# Patient Record
Sex: Male | Born: 1975 | Race: Black or African American | Hispanic: No | Marital: Single | Smoking: Current every day smoker
Health system: Southern US, Community
[De-identification: ages and names within clinical notes are randomized; demographics above are authoritative.]

## PROBLEM LIST (undated history)

## (undated) DIAGNOSIS — M549 Dorsalgia, unspecified: Secondary | ICD-10-CM

## (undated) DIAGNOSIS — I10 Essential (primary) hypertension: Secondary | ICD-10-CM

---

## 2009-01-01 ENCOUNTER — Emergency Department (HOSPITAL_COMMUNITY): Admission: EM | Admit: 2009-01-01 | Discharge: 2009-01-01 | Payer: Self-pay | Admitting: Family Medicine

## 2009-12-15 ENCOUNTER — Emergency Department: Payer: Self-pay | Admitting: Internal Medicine

## 2010-01-17 ENCOUNTER — Emergency Department: Payer: Self-pay | Admitting: Emergency Medicine

## 2010-01-20 ENCOUNTER — Emergency Department: Payer: Self-pay | Admitting: Emergency Medicine

## 2010-01-21 ENCOUNTER — Emergency Department: Payer: Self-pay | Admitting: Emergency Medicine

## 2010-06-03 ENCOUNTER — Ambulatory Visit: Payer: Self-pay

## 2010-09-10 ENCOUNTER — Emergency Department: Payer: Self-pay | Admitting: Unknown Physician Specialty

## 2010-09-12 ENCOUNTER — Emergency Department: Payer: Self-pay | Admitting: Emergency Medicine

## 2014-07-02 ENCOUNTER — Emergency Department (HOSPITAL_COMMUNITY): Payer: No Typology Code available for payment source

## 2014-07-02 ENCOUNTER — Encounter (HOSPITAL_COMMUNITY): Payer: Self-pay | Admitting: Emergency Medicine

## 2014-07-02 ENCOUNTER — Emergency Department (HOSPITAL_COMMUNITY)
Admission: EM | Admit: 2014-07-02 | Discharge: 2014-07-02 | Disposition: A | Discharge status: Home / Self Care | Payer: No Typology Code available for payment source | Attending: Emergency Medicine | Admitting: Emergency Medicine

## 2014-07-02 DIAGNOSIS — S3992XA Unspecified injury of lower back, initial encounter: Secondary | ICD-10-CM | POA: Insufficient documentation

## 2014-07-02 DIAGNOSIS — Y92410 Unspecified street and highway as the place of occurrence of the external cause: Secondary | ICD-10-CM | POA: Diagnosis not present

## 2014-07-02 DIAGNOSIS — S139XXA Sprain of joints and ligaments of unspecified parts of neck, initial encounter: Secondary | ICD-10-CM

## 2014-07-02 DIAGNOSIS — Y9389 Activity, other specified: Secondary | ICD-10-CM | POA: Diagnosis not present

## 2014-07-02 DIAGNOSIS — S134XXA Sprain of ligaments of cervical spine, initial encounter: Secondary | ICD-10-CM | POA: Diagnosis not present

## 2014-07-02 DIAGNOSIS — Z720 Tobacco use: Secondary | ICD-10-CM | POA: Insufficient documentation

## 2014-07-02 DIAGNOSIS — S199XXA Unspecified injury of neck, initial encounter: Secondary | ICD-10-CM | POA: Diagnosis present

## 2014-07-02 DIAGNOSIS — S0990XA Unspecified injury of head, initial encounter: Secondary | ICD-10-CM | POA: Insufficient documentation

## 2014-07-02 DIAGNOSIS — I10 Essential (primary) hypertension: Secondary | ICD-10-CM | POA: Insufficient documentation

## 2014-07-02 DIAGNOSIS — Z79899 Other long term (current) drug therapy: Secondary | ICD-10-CM | POA: Diagnosis not present

## 2014-07-02 HISTORY — DX: Essential (primary) hypertension: I10

## 2014-07-02 HISTORY — DX: Dorsalgia, unspecified: M54.9

## 2014-07-02 MED ORDER — ONDANSETRON 4 MG PO TBDP: 4.0000 mg | ORAL_TABLET | Freq: Once | ORAL | Status: DC

## 2014-07-02 MED ORDER — HYDROCODONE-ACETAMINOPHEN 5-325 MG PO TABS: 1.0000 | ORAL_TABLET | Freq: Once | ORAL | Status: DC

## 2014-07-02 MED ORDER — IBUPROFEN 600 MG PO TABS: 600.0000 mg | ORAL_TABLET | Freq: Four times a day (QID) | ORAL | Status: AC | PRN

## 2014-07-02 MED ORDER — TRAMADOL HCL 50 MG PO TABS
50.0000 mg | ORAL_TABLET | Freq: Once | ORAL | Status: AC
  Administered 2014-07-02: 50 mg via ORAL
  Filled 2014-07-02: qty 1

## 2014-07-02 MED ORDER — CYCLOBENZAPRINE HCL 10 MG PO TABS: 10.0000 mg | ORAL_TABLET | Freq: Two times a day (BID) | ORAL | Status: DC | PRN

## 2014-07-02 MED ORDER — TRAMADOL HCL 50 MG PO TABS: 50.0000 mg | ORAL_TABLET | Freq: Four times a day (QID) | ORAL | Status: DC | PRN

## 2014-07-02 NOTE — ED Notes (Signed)
Declined W/C at D/C and was escorted to lobby by RN. 
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
## 2014-07-02 NOTE — Discharge Instructions (Signed)
Cervical Sprain °A cervical sprain is an injury in the neck in which the strong, fibrous tissues (ligaments) that connect your neck bones stretch or tear. Cervical sprains can range from mild to severe. Severe cervical sprains can cause the neck vertebrae to be unstable. This can lead to damage of the spinal cord and can result in serious nervous system problems. The amount of time it takes for a cervical sprain to get better depends on the cause and extent of the injury. Most cervical sprains heal in 1 to 3 weeks. °CAUSES  °Severe cervical sprains may be caused by:  °· Contact sport injuries (such as from football, rugby, wrestling, hockey, auto racing, gymnastics, diving, martial arts, or boxing).   °· Motor vehicle collisions.   °· Whiplash injuries. This is an injury from a sudden forward and backward whipping movement of the head and neck.  °· Falls.   °Mild cervical sprains may be caused by:  °· Being in an awkward position, such as while cradling a telephone between your ear and shoulder.   °· Sitting in a chair that does not offer proper support.   °· Working at a poorly designed computer station.   °· Looking up or down for long periods of time.   °SYMPTOMS  °· Pain, soreness, stiffness, or a burning sensation in the front, back, or sides of the neck. This discomfort may develop immediately after the injury or slowly, 24 hours or more after the injury.   °· Pain or tenderness directly in the middle of the back of the neck.   °· Shoulder or upper back pain.   °· Limited ability to move the neck.   °· Headache.   °· Dizziness.   °· Weakness, numbness, or tingling in the hands or arms.   °· Muscle spasms.   °· Difficulty swallowing or chewing.   °· Tenderness and swelling of the neck.   °DIAGNOSIS  °Most of the time your health care provider can diagnose a cervical sprain by taking your history and doing a physical exam. Your health care provider will ask about previous neck injuries and any known neck  problems, such as arthritis in the neck. X-rays may be taken to find out if there are any other problems, such as with the bones of the neck. Other tests, such as a CT scan or MRI, may also be needed.  °TREATMENT  °Treatment depends on the severity of the cervical sprain. Mild sprains can be treated with rest, keeping the neck in place (immobilization), and pain medicines. Severe cervical sprains are immediately immobilized. Further treatment is done to help with pain, muscle spasms, and other symptoms and may include: °· Medicines, such as pain relievers, numbing medicines, or muscle relaxants.   °· Physical therapy. This may involve stretching exercises, strengthening exercises, and posture training. Exercises and improved posture can help stabilize the neck, strengthen muscles, and help stop symptoms from returning.   °HOME CARE INSTRUCTIONS  °· Put ice on the injured area.   °¨ Put ice in a plastic bag.   °¨ Place a towel between your skin and the bag.   °¨ Leave the ice on for 15-20 minutes, 3-4 times a day.   °· If your injury was severe, you may have been given a cervical collar to wear. A cervical collar is a two-piece collar designed to keep your neck from moving while it heals. °¨ Do not remove the collar unless instructed by your health care provider. °¨ If you have long hair, keep it outside of the collar. °¨ Ask your health care provider before making any adjustments to your collar. Minor   adjustments may be required over time to improve comfort and reduce pressure on your chin or on the back of your head. °¨ If you are allowed to remove the collar for cleaning or bathing, follow your health care provider's instructions on how to do so safely. °¨ Keep your collar clean by wiping it with mild soap and water and drying it completely. If the collar you have been given includes removable pads, remove them every 1-2 days and hand wash them with soap and water. Allow them to air dry. They should be completely  dry before you wear them in the collar. °¨ If you are allowed to remove the collar for cleaning and bathing, wash and dry the skin of your neck. Check your skin for irritation or sores. If you see any, tell your health care provider. °¨ Do not drive while wearing the collar.   °· Only take over-the-counter or prescription medicines for pain, discomfort, or fever as directed by your health care provider.   °· Keep all follow-up appointments as directed by your health care provider.   °· Keep all physical therapy appointments as directed by your health care provider.   °· Make any needed adjustments to your workstation to promote good posture.   °· Avoid positions and activities that make your symptoms worse.   °· Warm up and stretch before being active to help prevent problems.   °SEEK MEDICAL CARE IF:  °· Your pain is not controlled with medicine.   °· You are unable to decrease your pain medicine over time as planned.   °· Your activity level is not improving as expected.   °SEEK IMMEDIATE MEDICAL CARE IF:  °· You develop any bleeding. °· You develop stomach upset. °· You have signs of an allergic reaction to your medicine.   °· Your symptoms get worse.   °· You develop new, unexplained symptoms.   °· You have numbness, tingling, weakness, or paralysis in any part of your body.   °MAKE SURE YOU:  °· Understand these instructions. °· Will watch your condition. °· Will get help right away if you are not doing well or get worse. °Document Released: 07/06/2007 Document Revised: 09/13/2013 Document Reviewed: 03/16/2013 °[employer]® Patient Information ©2015 [employer]. This information is not intended to replace advice given to you by your health care provider. Make sure you discuss any questions you have with your health care provider. ° °Motor Vehicle Collision °It is common to have multiple bruises and sore muscles after a motor vehicle collision (MVC). These tend to feel worse for the first 24 hours. You may have  the most stiffness and soreness over the first several hours. You may also feel worse when you wake up the first morning after your collision. After this point, you will usually begin to improve with each day. The speed of improvement often depends on the severity of the collision, the number of injuries, and the location and nature of these injuries. °HOME CARE INSTRUCTIONS °· Put ice on the injured area. °¨ Put ice in a plastic bag. °¨ Place a towel between your skin and the bag. °¨ Leave the ice on for 15-20 minutes, 3-4 times a day, or as directed by your health care provider. °· Drink enough fluids to keep your urine clear or pale yellow. Do not drink alcohol. °· Take a warm shower or bath once or twice a day. This will increase blood flow to sore muscles. °· You may return to activities as directed by your caregiver. Be careful when lifting, as this may aggravate neck or back   pain. °· Only take over-the-counter or prescription medicines for pain, discomfort, or fever as directed by your caregiver. Do not use aspirin. This may increase bruising and bleeding. °SEEK IMMEDIATE MEDICAL CARE IF: °· You have numbness, tingling, or weakness in the arms or legs. °· You develop severe headaches not relieved with medicine. °· You have severe neck pain, especially tenderness in the middle of the back of your neck. °· You have changes in bowel or bladder control. °· There is increasing pain in any area of the body. °· You have shortness of breath, light-headedness, dizziness, or fainting. °· You have chest pain. °· You feel sick to your stomach (nauseous), throw up (vomit), or sweat. °· You have increasing abdominal discomfort. °· There is blood in your urine, stool, or vomit. °· You have pain in your shoulder (shoulder strap areas). °· You feel your symptoms are getting worse. °MAKE SURE YOU: °· Understand these instructions. °· Will watch your condition. °· Will get help right away if you are not doing well or get  worse. °Document Released: 09/08/2005 Document Revised: 01/23/2014 Document Reviewed: 02/05/2011 °[employer]® Patient Information ©2015 [employer]. This information is not intended to replace advice given to you by your health care provider. Make sure you discuss any questions you have with your health care provider. ° °
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
## 2014-07-06 NOTE — ED Provider Notes (Signed)
Medical screening examination/treatment/procedure(s) were performed by non-physician practitioner and as supervising physician I was immediately available for consultation/collaboration.   EKG Interpretation None        Glynn OctaveStephen Evah Rashid, MD 07/06/14 2105
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
## 2019-10-06 ENCOUNTER — Encounter (HOSPITAL_COMMUNITY): Payer: Self-pay | Admitting: Emergency Medicine

## 2019-10-06 ENCOUNTER — Other Ambulatory Visit: Payer: Self-pay

## 2019-10-06 ENCOUNTER — Ambulatory Visit (HOSPITAL_COMMUNITY)
Admission: EM | Admit: 2019-10-06 | Discharge: 2019-10-06 | Disposition: A | Discharge status: Home / Self Care | Payer: Self-pay | Attending: Family Medicine | Admitting: Family Medicine

## 2019-10-06 DIAGNOSIS — Z202 Contact with and (suspected) exposure to infections with a predominantly sexual mode of transmission: Secondary | ICD-10-CM

## 2019-10-06 DIAGNOSIS — I10 Essential (primary) hypertension: Secondary | ICD-10-CM

## 2019-10-06 MED ORDER — METRONIDAZOLE 500 MG PO TABS
2000.0000 mg | ORAL_TABLET | Freq: Once | ORAL | Status: AC
Start: 2019-10-06 — End: 2019-10-06
  Administered 2019-10-06: 2000 mg via ORAL

## 2019-10-06 MED ORDER — METRONIDAZOLE 500 MG PO TABS
ORAL_TABLET | ORAL | Status: AC
  Filled 2019-10-06: qty 4

## 2019-10-06 NOTE — Discharge Instructions (Signed)
Treated you for trichomoniasis today.  Refrain from sexual activity for at least 1 week to prevent reinfection.

## 2019-10-06 NOTE — ED Provider Notes (Signed)
[facility]    CSN: [id number] Arrival date & time: 10/06/19  1201      History   Chief Complaint Chief Complaint  Patient presents with  . Exposure to STD    HPI Jon Sutton is a 44 y.o. male.   Patient is a 44 year old male with past medical history of hypertension.  He presents today for exposure to STDs.  Reporting he was exposed to trichomonas.  Sexually active with multiple partners.  Currently asymptomatic   Exposure to STD    Past Medical History:  Diagnosis Date  . Back pain   . Hypertension     There are no problems to display for this patient.   History reviewed. No pertinent surgical history.     Home Medications    Prior to Admission medications   Medication Sig Start Date End Date Taking? Authorizing Provider  ibuprofen (ADVIL,MOTRIN) 600 MG tablet Take 1 tablet (600 mg total) by mouth every 6 (six) hours as needed. 07/02/14   Marlon Pel, PA-C    Family History Family History  Problem Relation Age of Onset  . Healthy Mother     Social History Social History   Tobacco Use  . Smoking status: Current Every Day Smoker  . Smokeless tobacco: Never Used  Substance Use Topics  . Alcohol use: Yes  . Drug use: Yes    Types: Marijuana     Allergies   Patient has no known allergies.   Review of Systems Review of Systems  Genitourinary: Negative for decreased urine volume, difficulty urinating, discharge, dysuria, enuresis, flank pain, frequency, genital sores, hematuria, penile pain, penile swelling, scrotal swelling, testicular pain and urgency.     Physical Exam Triage Vital Signs ED Triage Vitals  Enc Vitals Group     BP 10/06/19 1224 (!) 156/101     Pulse Rate 10/06/19 1224 94     Resp 10/06/19 1224 18     Temp 10/06/19 1224 98.3 F (36.8 C)     Temp Source 10/06/19 1224 Oral     SpO2 10/06/19 1224 97 %     Weight --      Height --      Head Circumference --      Peak Flow --      Pain Score  10/06/19 1221 0     Pain Loc --      Pain Edu? --      Excl. in GC? --    No data found.  Updated Vital Signs BP (!) 156/101 (BP Location: Right Arm)   Pulse 94   Temp 98.3 F (36.8 C) (Oral)   Resp 18   SpO2 97%   Visual Acuity Right Eye Distance:   Left Eye Distance:   Bilateral Distance:    Right Eye Near:   Left Eye Near:    Bilateral Near:     Physical Exam Vitals and nursing note reviewed.  Constitutional:      Appearance: Normal appearance.  HENT:     Head: Normocephalic and atraumatic.     Nose: Nose normal.  Eyes:     Conjunctiva/sclera: Conjunctivae normal.  Pulmonary:     Effort: Pulmonary effort is normal.  Musculoskeletal:        General: Normal range of motion.     Cervical back: Normal range of motion.  Skin:    General: Skin is warm and dry.  Neurological:     Mental Status: He is alert.  Psychiatric:  Mood and Affect: Mood normal.      UC Treatments / Results  Labs (all labs ordered are listed, but only abnormal results are displayed) Labs Reviewed  CYTOLOGY, (ORAL, ANAL, URETHRAL) ANCILLARY ONLY    EKG   Radiology No results found.  Procedures Procedures (including critical care time)  Medications Ordered in UC Medications  metroNIDAZOLE (FLAGYL) tablet 2,000 mg (2,000 mg Oral Given 10/06/19 1310)    Initial Impression / Assessment and Plan / UC Course  I have reviewed the triage vital signs and the nursing notes.  Pertinent labs & imaging results that were available during my care of the patient were reviewed by me and considered in my medical decision making (see chart for details).     STD exposure- exposed to trichomoniasis. Asymptomatic Treated here today with flagyl.  Safe sex practices Follow up as needed for continued or worsening symptoms  Final Clinical Impressions(s) / UC Diagnoses   Final diagnoses:  STD exposure     Discharge Instructions     Treated you for trichomoniasis today.  Refrain  from sexual activity for at least 1 week to prevent reinfection.     ED Prescriptions    None     PDMP not reviewed this encounter.   Orvan July, NP 10/06/19 1353

## 2019-10-06 NOTE — ED Triage Notes (Signed)
Pt is here to be treated for Trichomonas.  He was told he was exposed.  Pt is having no symptoms.

## 2019-10-07 ENCOUNTER — Telehealth (HOSPITAL_COMMUNITY): Payer: Self-pay | Admitting: Emergency Medicine

## 2019-10-07 LAB — CYTOLOGY, (ORAL, ANAL, URETHRAL) ANCILLARY ONLY
Chlamydia: NEGATIVE
Neisseria Gonorrhea: NEGATIVE
Trichomonas: POSITIVE — AB

## 2019-10-07 NOTE — Telephone Encounter (Signed)
Trichomonas is positive. Rx metronidazole was given at the urgent care visit. Pt needs education to please refrain from sexual intercourse for 7 days to give the medicine time to work. Sexual partners need to be notified and tested/treated. Condoms may reduce risk of reinfection. Recheck for further evaluation if symptoms are not improving.   Patient contacted by phone and made aware of    results. Pt verbalized understanding and had all questions answered.   

## 2019-11-02 ENCOUNTER — Encounter (HOSPITAL_COMMUNITY): Payer: Self-pay | Admitting: Emergency Medicine

## 2019-11-02 ENCOUNTER — Other Ambulatory Visit: Payer: Self-pay

## 2019-11-02 ENCOUNTER — Ambulatory Visit (HOSPITAL_COMMUNITY)
Admission: EM | Admit: 2019-11-02 | Discharge: 2019-11-02 | Disposition: A | Discharge status: Home / Self Care | Payer: Self-pay | Attending: Family Medicine | Admitting: Family Medicine

## 2019-11-02 DIAGNOSIS — Z113 Encounter for screening for infections with a predominantly sexual mode of transmission: Secondary | ICD-10-CM | POA: Insufficient documentation

## 2019-11-02 NOTE — ED Triage Notes (Signed)
PT treated for trich at last visit. He had sexual contact day 3 after treatment. He is here for a recheck, no symptoms.

## 2019-11-02 NOTE — ED Provider Notes (Addendum)
[facility]    CSN: [id number] Arrival date & time: 11/02/19  1046      History   Chief Complaint Chief Complaint  Patient presents with  . Exposure to STD    HPI Jon Sutton is a 44 y.o. male.   Patient reports to urgent care today for STD check. He tested positive and was treated for trichomonas in 09/2019. He reports sexual contact with the same partner 3 days after both completed treatment. He denies penile discharge or painful urination. He is not interested in HIV or syphillis testing today.  He was not wearing condoms during these encounters  Patient is asking for treatment today, however we discussed that it is best practice that given he is not having symptoms that we should wait until his results are back. We discussed that we would call him with results and arrange treatment and that this does not require additional visits.   Patient actively playing game on phone throughout entire visit.     Past Medical History:  Diagnosis Date  . Back pain   . Hypertension     There are no problems to display for this patient.   History reviewed. No pertinent surgical history.     Home Medications    Prior to Admission medications   Medication Sig Start Date End Date Taking? Authorizing Provider  ibuprofen (ADVIL,MOTRIN) 600 MG tablet Take 1 tablet (600 mg total) by mouth every 6 (six) hours as needed. 07/02/14   Marlon Pel, PA-C    Family History Family History  Problem Relation Age of Onset  . Healthy Mother     Social History Social History   Tobacco Use  . Smoking status: Current Every Day Smoker  . Smokeless tobacco: Never Used  Substance Use Topics  . Alcohol use: Yes  . Drug use: Yes    Types: Marijuana     Allergies   Patient has no known allergies.   Review of Systems Review of Systems  Constitutional: Negative for fever.  Genitourinary: Negative for discharge, dysuria, frequency, genital sores, penile pain, penile  swelling, scrotal swelling, testicular pain and urgency.  Musculoskeletal: Negative for arthralgias and myalgias.  All other systems reviewed and are negative.    Physical Exam Triage Vital Signs ED Triage Vitals  Enc Vitals Group     BP 11/02/19 1058 (!) 148/106     Pulse Rate 11/02/19 1058 94     Resp 11/02/19 1058 16     Temp 11/02/19 1058 99 F (37.2 C)     Temp Source 11/02/19 1058 Oral     SpO2 11/02/19 1058 100 %     Weight --      Height --      Head Circumference --      Peak Flow --      Pain Score 11/02/19 1101 2     Pain Loc --      Pain Edu? --      Excl. in GC? --    No data found.  Updated Vital Signs BP (!) 148/106   Pulse 94   Temp 99 F (37.2 C) (Oral)   Resp 16   SpO2 100%   Visual Acuity Right Eye Distance:   Left Eye Distance:   Bilateral Distance:    Right Eye Near:   Left Eye Near:    Bilateral Near:     Physical Exam Vitals and nursing note reviewed.  Constitutional:      Appearance: Normal  appearance. He is well-developed.     Comments: Distracted by game on phone  HENT:     Head: Normocephalic and atraumatic.  Eyes:     General: No scleral icterus.    Conjunctiva/sclera: Conjunctivae normal.  Cardiovascular:     Rate and Rhythm: Normal rate.  Pulmonary:     Effort: Pulmonary effort is normal. No respiratory distress.  Genitourinary:    Comments: Patient refused provider exam and requested self swab not be supervised. Musculoskeletal:     Cervical back: Neck supple.  Skin:    General: Skin is warm and dry.  Neurological:     General: No focal deficit present.     Mental Status: He is alert and oriented to person, place, and time.  Psychiatric:        Mood and Affect: Mood normal.        Behavior: Behavior normal.        Thought Content: Thought content normal.        Judgment: Judgment normal.      UC Treatments / Results  Labs (all labs ordered are listed, but only abnormal results are displayed) Labs Reviewed -  No data to display  EKG   Radiology No results found.  Procedures Procedures (including critical care time)  Medications Ordered in UC Medications - No data to display  Initial Impression / Assessment and Plan / UC Course  I have reviewed the triage vital signs and the nursing notes.  Pertinent labs & imaging results that were available during my care of the patient were reviewed by me and considered in my medical decision making (see chart for details).     #STD screen - no symptoms. Swab for GC/CT and trich sent.   Final Clinical Impressions(s) / UC Diagnoses   Final diagnoses:  None   Discharge Instructions   None    ED Prescriptions    None     PDMP not reviewed this encounter.   Purnell Shoemaker, PA-C 11/02/19 1118    Dhanya Bogle, Marguerita Beards, PA-C 11/02/19 1118

## 2019-11-02 NOTE — Discharge Instructions (Signed)
We have sent your swab for analysis, we will call you with any positive results and arrange treatment.  Negative results will be in your mychart  Abstain from sexual intercourse until you have received your results and any possible treatments

## 2019-11-03 LAB — CYTOLOGY, (ORAL, ANAL, URETHRAL) ANCILLARY ONLY
Chlamydia: NEGATIVE
Neisseria Gonorrhea: NEGATIVE
Trichomonas: NEGATIVE
